# Patient Record
Sex: Female | Born: 1950 | Race: White | Hispanic: No | Marital: Married | Smoking: Former smoker
Health system: Southern US, Community
[De-identification: ages and names within clinical notes are randomized; demographics above are authoritative.]

## PROBLEM LIST (undated history)

## (undated) DIAGNOSIS — D649 Anemia, unspecified: Secondary | ICD-10-CM

## (undated) HISTORY — PX: TONSILLECTOMY: SUR1361

---

## 1998-10-29 ENCOUNTER — Other Ambulatory Visit: Admission: RE | Admit: 1998-10-29 | Discharge: 1998-10-29 | Payer: Self-pay | Admitting: Obstetrics and Gynecology

## 2000-07-12 ENCOUNTER — Other Ambulatory Visit: Admission: RE | Admit: 2000-07-12 | Discharge: 2000-07-12 | Payer: Self-pay | Admitting: Obstetrics and Gynecology

## 2000-07-15 ENCOUNTER — Encounter: Payer: Self-pay | Admitting: Family Medicine

## 2000-07-15 ENCOUNTER — Encounter: Admission: RE | Admit: 2000-07-15 | Discharge: 2000-07-15 | Payer: Self-pay | Admitting: Family Medicine

## 2001-06-20 ENCOUNTER — Other Ambulatory Visit: Admission: RE | Admit: 2001-06-20 | Discharge: 2001-06-20 | Payer: Self-pay | Admitting: Obstetrics and Gynecology

## 2001-07-17 ENCOUNTER — Encounter: Payer: Self-pay | Admitting: Obstetrics and Gynecology

## 2001-07-17 ENCOUNTER — Encounter: Admission: RE | Admit: 2001-07-17 | Discharge: 2001-07-17 | Payer: Self-pay | Admitting: Obstetrics and Gynecology

## 2003-06-13 ENCOUNTER — Ambulatory Visit (HOSPITAL_COMMUNITY): Admission: RE | Admit: 2003-06-13 | Discharge: 2003-06-13 | Payer: Self-pay | Admitting: Gastroenterology

## 2003-07-18 ENCOUNTER — Encounter: Admission: RE | Admit: 2003-07-18 | Discharge: 2003-07-18 | Payer: Self-pay | Admitting: Obstetrics and Gynecology

## 2003-07-18 ENCOUNTER — Encounter: Payer: Self-pay | Admitting: Obstetrics and Gynecology

## 2007-02-07 ENCOUNTER — Encounter: Admission: RE | Admit: 2007-02-07 | Discharge: 2007-02-07 | Payer: Self-pay | Admitting: Obstetrics and Gynecology

## 2010-07-06 ENCOUNTER — Encounter: Admission: RE | Admit: 2010-07-06 | Discharge: 2010-07-06 | Payer: Self-pay | Admitting: Obstetrics and Gynecology

## 2011-05-07 NOTE — Op Note (Signed)
   NAMELAVELL, Escobar                         ACCOUNT NO.:  [id number]   MEDICAL RECORD NO.:  [id number]                   PATIENT TYPE:  AMB   LOCATION:  ENDO                                 FACILITY:  [facility]   PHYSICIAN:  Danise Edge, M.D.                DATE OF BIRTH:  March 23, 1951   DATE OF PROCEDURE:  06/13/2003  DATE OF DISCHARGE:                                 OPERATIVE REPORT   PROCEDURE:  Screening colonoscopy.   REFERRING PHYSICIAN:  Dellis Anes. Idell Pickles, M.D.   INDICATIONS FOR PROCEDURE:  [name] is a 60 year old female born  [birth year].  [name] 80 year old brother was diagnosed with  colon cancer.   ENDOSCOPIST:  Charolett Bumpers, M.D.   PREMEDICATION:  Versed 7.5 mg, Demerol 50 mg.   PROCEDURE:  After obtaining confirmed consent, [name] was placed in  the left lateral decubitus position.  I administered intravenous Demerol and  intravenous  Versed to achieve conscious sedation for the procedure.  The  patient's blood pressure, oxygen saturation, and cardiac rhythm were  monitored throughout the procedure and documented in the medical record.  Anal inspection was normal.  Digital rectal exam was normal.  The Olympus  adjustable pediatric video colonoscope was introduced into the rectum and  advanced to the cecum.  Colonic preparation for the exam today was  excellent.   Rectum normal.  Sigmoid colon and descending colon:  Normal.  Splenic flexure normal.  Transverse colon normal.  Hepatic flexure normal.  Ascending colon normal.  Cecum and ileocecal valve normal.    ASSESSMENT:  Normal screening proctocolonoscopy to the cecum.   RECOMMENDATIONS:  Repeat colonoscopy in five years.                                               Danise Edge, M.D.    MJ/MEDQ  D:  06/13/2003  T:  06/14/2003  Job:  [id number]   cc:   Dellis Anes. Idell Pickles, M.D.  [address]  [place]  [place] [address]  Fax: [phone]

## 2014-03-15 ENCOUNTER — Other Ambulatory Visit: Payer: Self-pay | Admitting: Obstetrics and Gynecology

## 2014-03-15 DIAGNOSIS — N951 Menopausal and female climacteric states: Secondary | ICD-10-CM

## 2014-04-03 ENCOUNTER — Ambulatory Visit
Admission: RE | Admit: 2014-04-03 | Discharge: 2014-04-03 | Disposition: A | Discharge status: Home / Self Care | Payer: BC Managed Care – PPO | Source: Ambulatory Visit | Attending: Obstetrics and Gynecology | Admitting: Obstetrics and Gynecology

## 2014-04-03 DIAGNOSIS — N951 Menopausal and female climacteric states: Secondary | ICD-10-CM

## 2014-12-26 ENCOUNTER — Other Ambulatory Visit: Payer: Self-pay | Admitting: Family Medicine

## 2014-12-26 DIAGNOSIS — M542 Cervicalgia: Secondary | ICD-10-CM

## 2015-01-06 ENCOUNTER — Ambulatory Visit
Admission: RE | Admit: 2015-01-06 | Discharge: 2015-01-06 | Disposition: A | Discharge status: Home / Self Care | Payer: Medicare Other | Source: Ambulatory Visit | Attending: Family Medicine | Admitting: Family Medicine

## 2015-01-06 DIAGNOSIS — M542 Cervicalgia: Secondary | ICD-10-CM

## 2016-03-21 IMAGING — US US SOFT TISSUE HEAD/NECK
1 series · 14 of 20 positions shown · non-contrast
Comparison: None.

CLINICAL DATA: 63-year-old female with a palpable mass in the deep
aspect of the posterior left shoulder.

EXAM:
ULTRASOUND OF HEAD/NECK SOFT TISSUES
TECHNIQUE: Ultrasound examination of the head and neck soft tissues was
performed in the area of clinical concern.

[Series 1: us soft tissue head/neck · 0.09mm/px · 14 of 20 slices shown]
[im 1/20]
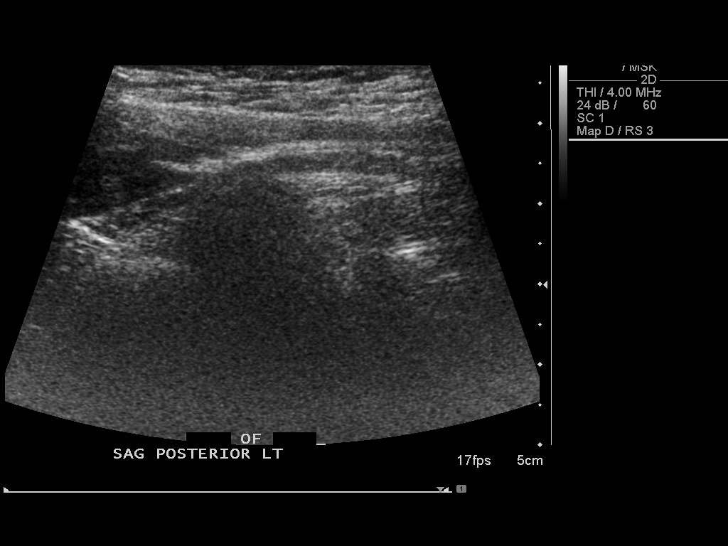
[im 3/20]
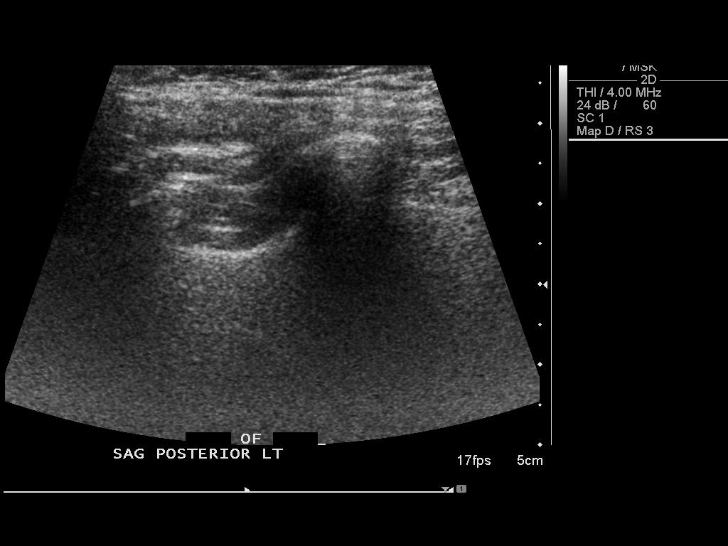
[im 4/20]
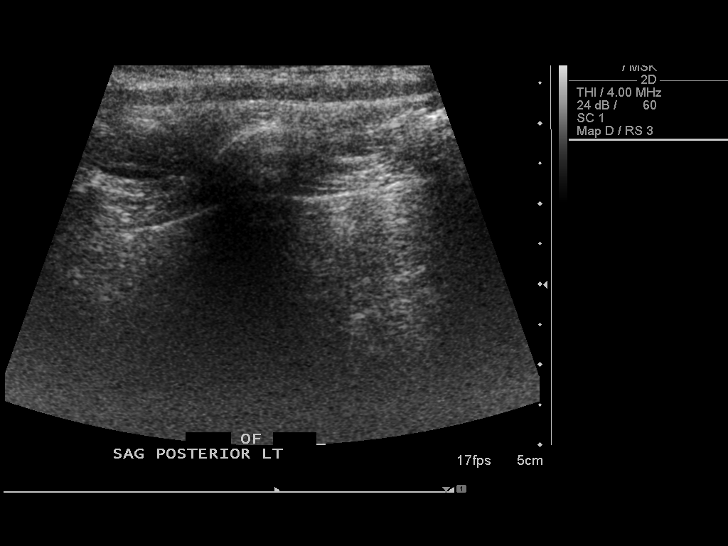
[im 6/20]
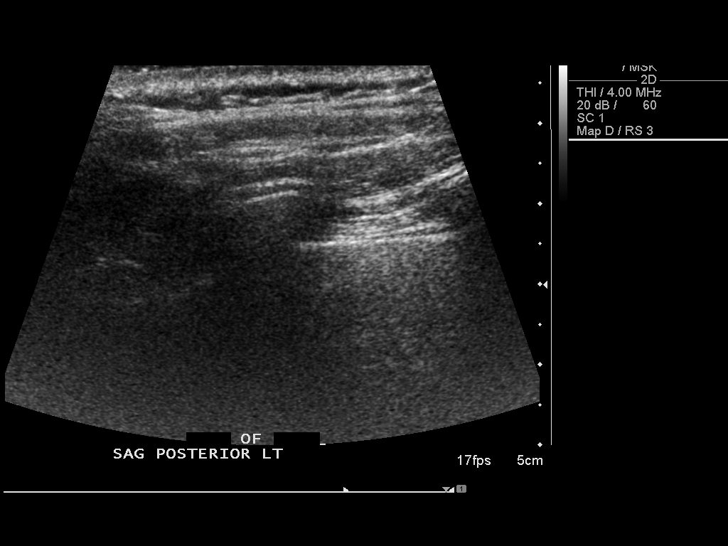
[im 7/20]
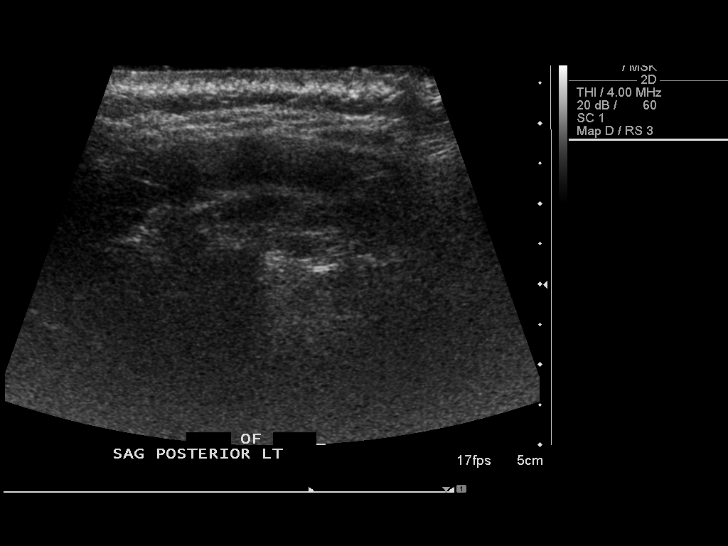
[im 8/20]
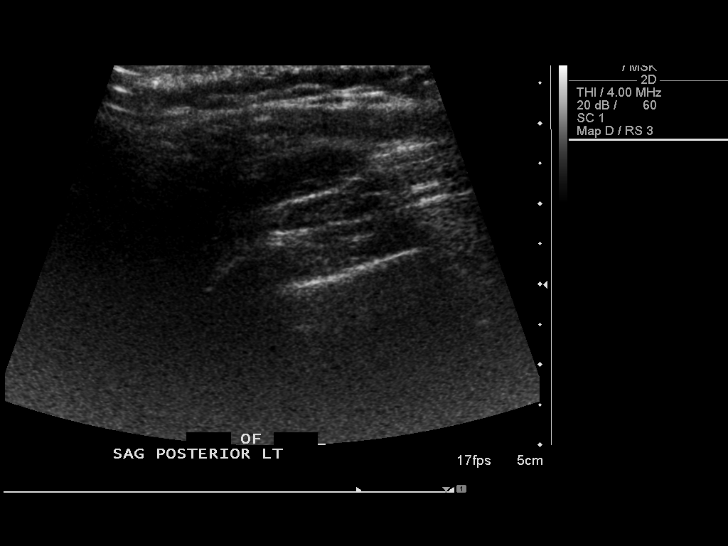
[im 10/20]
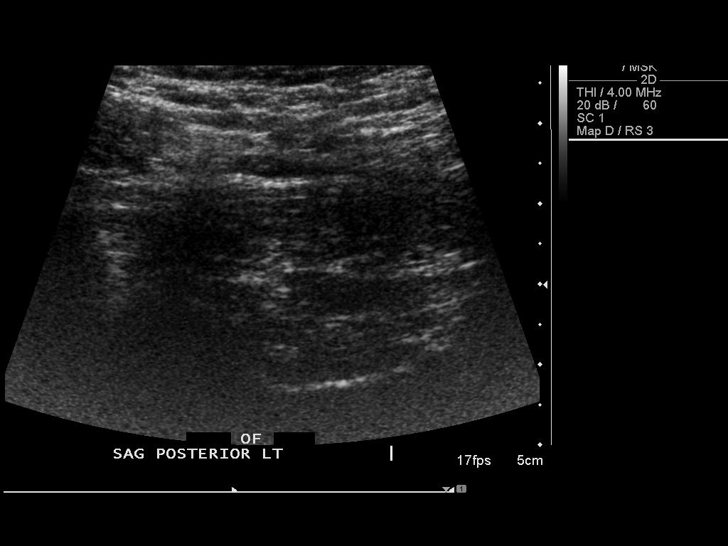
[im 11/20]
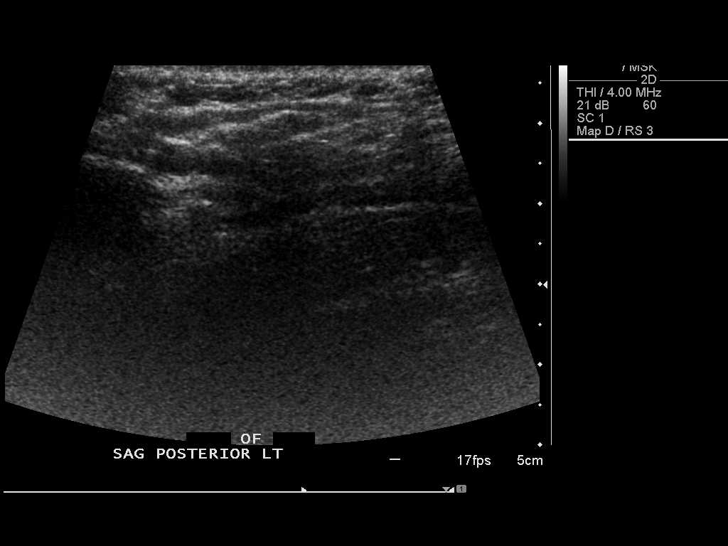
[im 13/20]
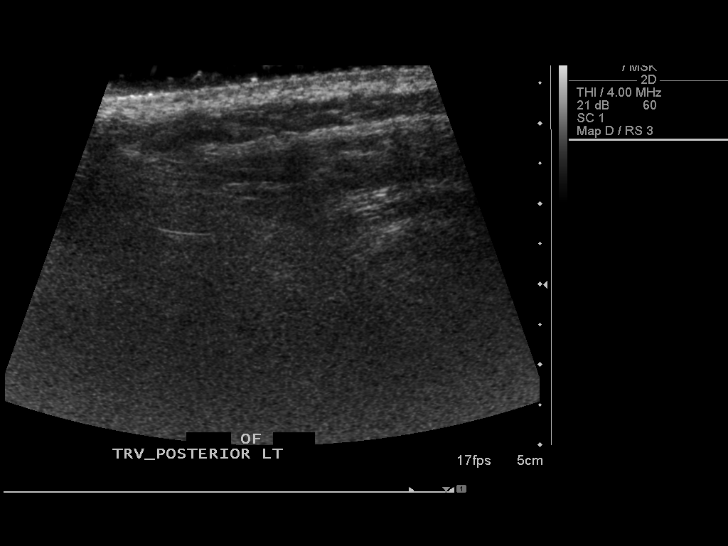
[im 14/20]
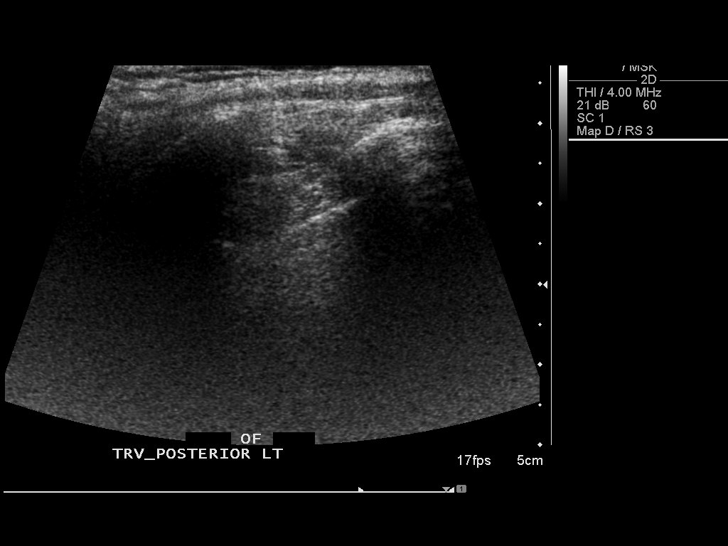
[im 16/20]
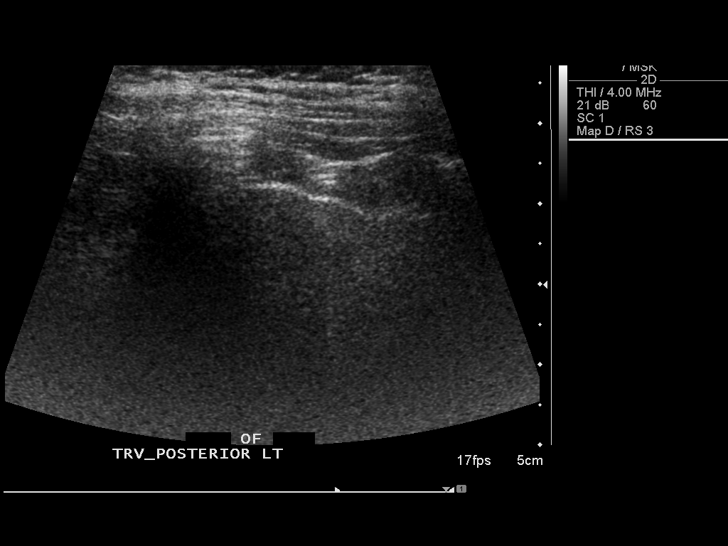
[im 17/20]
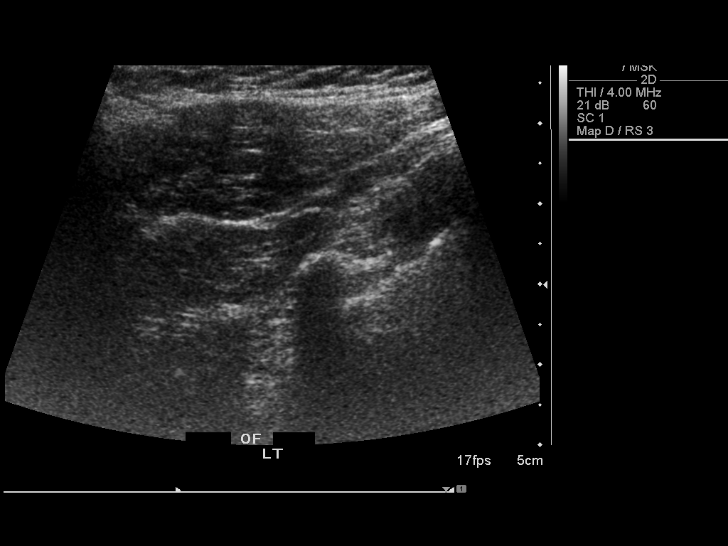
[im 18/20]
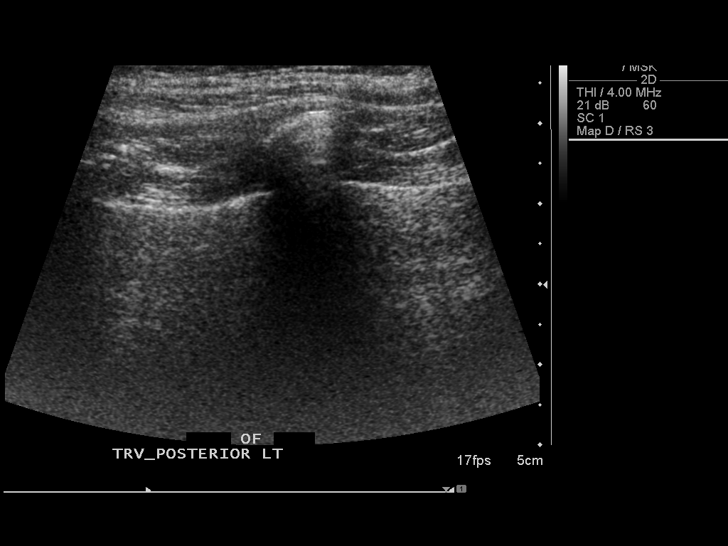
[im 20/20]
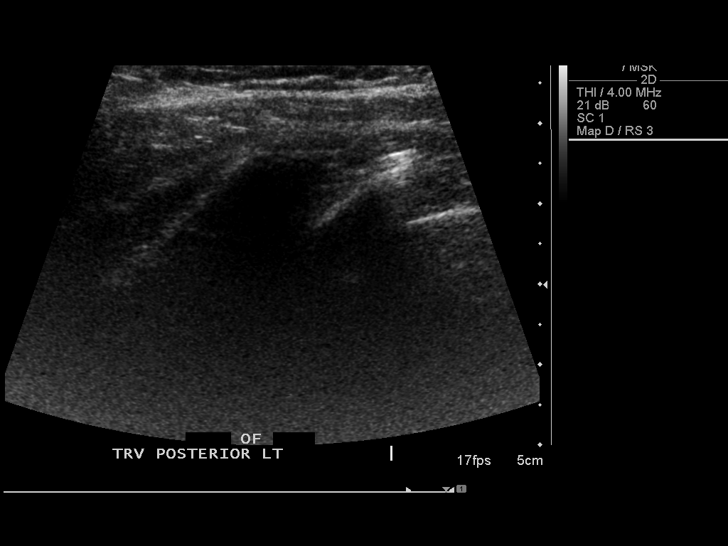

[14 of 20 positions shown; findings below may reference images not displayed]

FINDINGS: Sonographic evaluation of the region of the patient's pain and
palpable mass demonstrate no focal abnormality. There is no soft
tissue mass or fluid collection.
IMPRESSION: No abnormality identified sonographically.

## 2017-03-11 ENCOUNTER — Other Ambulatory Visit: Payer: Self-pay | Admitting: Gastroenterology

## 2017-04-29 ENCOUNTER — Encounter (HOSPITAL_COMMUNITY): Payer: Self-pay

## 2017-05-02 ENCOUNTER — Ambulatory Visit (HOSPITAL_COMMUNITY)
Admission: RE | Admit: 2017-05-02 | Discharge: 2017-05-02 | Disposition: A | Discharge status: Home / Self Care | Payer: Medicare Other | Source: Ambulatory Visit | Attending: Gastroenterology | Admitting: Gastroenterology

## 2017-05-02 ENCOUNTER — Encounter (HOSPITAL_COMMUNITY)
Admission: RE | Disposition: A | Discharge status: Home / Self Care | Payer: Self-pay | Source: Ambulatory Visit | Attending: Gastroenterology

## 2017-05-02 ENCOUNTER — Ambulatory Visit (HOSPITAL_COMMUNITY): Payer: Medicare Other | Admitting: Certified Registered Nurse Anesthetist

## 2017-05-02 ENCOUNTER — Encounter (HOSPITAL_COMMUNITY): Payer: Self-pay | Admitting: *Deleted

## 2017-05-02 DIAGNOSIS — Z800 Family history of malignant neoplasm of digestive organs: Secondary | ICD-10-CM | POA: Insufficient documentation

## 2017-05-02 DIAGNOSIS — Z880 Allergy status to penicillin: Secondary | ICD-10-CM | POA: Insufficient documentation

## 2017-05-02 DIAGNOSIS — M329 Systemic lupus erythematosus, unspecified: Secondary | ICD-10-CM | POA: Diagnosis not present

## 2017-05-02 DIAGNOSIS — M8580 Other specified disorders of bone density and structure, unspecified site: Secondary | ICD-10-CM | POA: Insufficient documentation

## 2017-05-02 DIAGNOSIS — Z1211 Encounter for screening for malignant neoplasm of colon: Secondary | ICD-10-CM | POA: Diagnosis not present

## 2017-05-02 DIAGNOSIS — Z87891 Personal history of nicotine dependence: Secondary | ICD-10-CM | POA: Insufficient documentation

## 2017-05-02 DIAGNOSIS — K562 Volvulus: Secondary | ICD-10-CM | POA: Insufficient documentation

## 2017-05-02 HISTORY — DX: Anemia, unspecified: D64.9

## 2017-05-02 HISTORY — PX: COLONOSCOPY WITH PROPOFOL: SHX5780

## 2017-05-02 SURGERY — COLONOSCOPY WITH PROPOFOL
Anesthesia: Monitor Anesthesia Care

## 2017-05-02 MED ORDER — SODIUM CHLORIDE 0.9 % IV SOLN: INTRAVENOUS | Status: DC

## 2017-05-02 MED ORDER — PROPOFOL 500 MG/50ML IV EMUL
INTRAVENOUS | Status: DC | PRN
  Administered 2017-05-02: 100 ug/kg/min via INTRAVENOUS

## 2017-05-02 MED ORDER — LACTATED RINGERS IV SOLN
INTRAVENOUS | Status: DC
  Administered 2017-05-02: 12:00:00 via INTRAVENOUS

## 2017-05-02 MED ORDER — PROPOFOL 10 MG/ML IV BOLUS
INTRAVENOUS | Status: AC
  Filled 2017-05-02: qty 40

## 2017-05-02 MED ORDER — PROPOFOL 10 MG/ML IV BOLUS
INTRAVENOUS | Status: DC | PRN
  Administered 2017-05-02 (×4): 20 mg via INTRAVENOUS
  Administered 2017-05-02: 50 mg via INTRAVENOUS

## 2017-05-02 SURGICAL SUPPLY — 21 items

## 2017-05-02 NOTE — Anesthesia Preprocedure Evaluation (Addendum)
Anesthesia Evaluation  Patient identified by MRN, date of birth, ID band Patient awake    Reviewed: Allergy & Precautions, NPO status , Patient's Chart, lab work & pertinent test results  Airway Mallampati: I  TM Distance: >3 FB Neck ROM: Full    Dental no notable dental hx.    Pulmonary neg pulmonary ROS, former smoker,    Pulmonary exam normal breath sounds clear to auscultation       Cardiovascular negative cardio ROS Normal cardiovascular exam Rhythm:Regular Rate:Normal     Neuro/Psych negative neurological ROS  negative psych ROS   GI/Hepatic negative GI ROS, Neg liver ROS,   Endo/Other  negative endocrine ROS  Renal/GU negative Renal ROS  negative genitourinary   Musculoskeletal negative musculoskeletal ROS (+)   Abdominal   Peds negative pediatric ROS (+)  Hematology negative hematology ROS (+)   Anesthesia Other Findings H/o Lupus  Reproductive/Obstetrics negative OB ROS                            Anesthesia Physical Anesthesia Plan  ASA: I  Anesthesia Plan: MAC   Post-op Pain Management:    Induction: Intravenous  Airway Management Planned:   Additional Equipment:   Intra-op Plan:   Post-operative Plan:   Informed Consent: I have reviewed the patients History and Physical, chart, labs and discussed the procedure including the risks, benefits and alternatives for the proposed anesthesia with the patient or authorized representative who has indicated his/her understanding and acceptance.   Dental advisory given  Plan Discussed with: CRNA and Surgeon  Anesthesia Plan Comments:        Anesthesia Quick Evaluation

## 2017-05-02 NOTE — Op Note (Signed)
[facility] Patient Name: Kathryn Escobar Procedure Date: 05/02/2017 MRN: [id number] Attending MD: Garlan Fair , MD Date of Birth: [birth year] CSN: [id number] Age: 66 Admit Type: Outpatient Procedure:                Colonoscopy Indications:              Screening patient at increased risk: Brother was                            diagnosed with colon cancer at age 46. Normal                            screening colonoscopies were performed in 2004 and                            2013. Providers:                Garlan Fair, MD, Laverta Baltimore RN, RN,                            Cherylynn Ridges, Technician, Heide Scales, CRNA Referring MD:              Medicines:                Propofol per Anesthesia Complications:            No immediate complications. Estimated Blood Loss:     Estimated blood loss: none. Procedure:                Pre-Anesthesia Assessment:                           - Prior to the procedure, a History and Physical                            was performed, and patient medications and                            allergies were reviewed. The patient's tolerance of                            previous anesthesia was also reviewed. The risks                            and benefits of the procedure and the sedation                            options and risks were discussed with the patient.                            All questions were answered, and informed consent                            was obtained. Prior Anticoagulants: The patient has  taken no previous anticoagulant or antiplatelet                            agents. ASA Grade Assessment: II - A patient with                            mild systemic disease. After reviewing the risks                            and benefits, the patient was deemed in                            satisfactory condition to undergo the procedure.                           After obtaining  informed consent, the colonoscope                            was passed under direct vision. Throughout the                            procedure, the patient's blood pressure, pulse, and                            oxygen saturations were monitored continuously. The                            EC-[id number] ([id number]) scope was introduced through                            the anus and advanced to the the cecum, identified                            by appendiceal orifice and ileocecal valve. The                            colonoscopy was technically difficult and complex                            due to significant looping. The patient tolerated                            the procedure well. The quality of the bowel                            preparation was good. The appendiceal orifice and                            the rectum were photographed. Scope In: 1:00:19 PM Scope Out: 1:26:43 PM Scope Withdrawal Time: 0 hours 11 minutes 53 seconds  Total Procedure Duration: 0 hours 26 minutes 24 seconds  Findings:      The perianal and digital rectal examinations were normal.      The entire examined colon appeared normal.  Impression:               - The entire examined colon is normal.                           - No specimens collected. Moderate Sedation:      N/A- Per Anesthesia Care Recommendation:           - Patient has a contact number available for                            emergencies. The signs and symptoms of potential                            delayed complications were discussed with the                            patient. Return to normal activities tomorrow.                            Written discharge instructions were provided to the                            patient.                           - Repeat colonoscopy in 8 years for screening                            purposes.                           - Resume previous diet.                           - Continue present  medications. Procedure Code(s):        --- Professional ---                           Z6109, Colorectal cancer screening; colonoscopy on                            individual at high risk Diagnosis Code(s):        --- Professional ---                           Z80.0, Family history of malignant neoplasm of                            digestive organs CPT copyright 2016 American Medical Association. All rights reserved. The codes documented in this report are preliminary and upon coder review may  be revised to meet current compliance requirements. Earle Gell, MD Garlan Fair, MD 05/02/2017 1:34:31 PM This report has been signed electronically. Number of Addenda: 0

## 2017-05-02 NOTE — H&P (Signed)
Procedure: Screening colonoscopy. Brother was diagnosed with colon cancer at age 68. Normal screening colonoscopies were performed in 2004 and 2013  History: The patient is a 66 year old female born [birth year]. She is scheduled to undergo a repeat screening colonoscopy today.  Medication allergies: Penicillin caused rash  Past medical history: Tonsillectomy. Systemic lupus erythematosus. Osteopenia. Low vitamin D level.  Exam: The patient is alert and lying comfortably on the endoscopy stretcher. Abdomen is soft and nontender to palpation. Lungs are clear to auscultation. Cardiac exam reveals a regular rhythm.  Plan: Proceed with screening colonoscopy

## 2017-05-02 NOTE — Transfer of Care (Signed)
Immediate Anesthesia Transfer of Care Note  Patient: Kathryn Escobar  Procedure(s) Performed: Procedure(s): COLONOSCOPY WITH PROPOFOL (N/A)  Patient Location: PACU  Anesthesia Type:MAC  Level of Consciousness: Patient easily awoken, sedated, comfortable, cooperative, following commands, responds to stimulation.   Airway & Oxygen Therapy: Patient spontaneously breathing, ventilating well, oxygen via simple oxygen mask.  Post-op Assessment: Report given to PACU RN, vital signs reviewed and stable, moving all extremities.   Post vital signs: Reviewed and stable.  Complications: No apparent anesthesia complications Last Vitals:  Vitals:   05/02/17 1148  BP: 125/72  Pulse: 65  Resp: 17  Temp: 36.4 C    Last Pain:  Vitals:   05/02/17 1148  TempSrc: Oral         Complications: No apparent anesthesia complications

## 2017-05-02 NOTE — Anesthesia Postprocedure Evaluation (Signed)
Anesthesia Post Note  Patient: Teriann Livingood  Procedure(s) Performed: Procedure(s) (LRB): COLONOSCOPY WITH PROPOFOL (N/A)  Patient location during evaluation: PACU Anesthesia Type: MAC Level of consciousness: awake and alert Pain management: pain level controlled Vital Signs Assessment: post-procedure vital signs reviewed and stable Respiratory status: spontaneous breathing, nonlabored ventilation, respiratory function stable and patient connected to nasal cannula oxygen Cardiovascular status: stable and blood pressure returned to baseline Anesthetic complications: no       Last Vitals:  Vitals:   05/02/17 1352 05/02/17 1359  BP: (!) 76/56 130/76  Pulse: 63 62  Resp: (!) 22 15  Temp:      Last Pain:  Vitals:   05/02/17 1148  TempSrc: Oral                 Ryan P Ellender

## 2017-05-02 NOTE — Discharge Instructions (Signed)

## 2017-05-03 ENCOUNTER — Encounter (HOSPITAL_COMMUNITY): Payer: Self-pay | Admitting: Gastroenterology

## 2020-01-14 ENCOUNTER — Ambulatory Visit: Payer: Medicare Other | Attending: Internal Medicine

## 2020-01-14 DIAGNOSIS — Z23 Encounter for immunization: Secondary | ICD-10-CM | POA: Insufficient documentation

## 2020-01-14 NOTE — Progress Notes (Signed)
   Covid-19 Vaccination Clinic  Name:  Kathryn Escobar    MRN: [id number] DOB: January 23, 1951  01/14/2020  Kathryn Escobar was observed post Covid-19 immunization for 15 minutes without incidence. She was provided with Vaccine Information Sheet and instruction to access the V-Safe system.   Kathryn Escobar was instructed to call 911 with any severe reactions post vaccine: Marland Kitchen Difficulty breathing  . Swelling of your face and throat  . A fast heartbeat  . A bad rash all over your body  . Dizziness and weakness    Immunizations Administered    Name Date Dose VIS Date Route   Pfizer COVID-19 Vaccine 01/14/2020  8:21 AM 0.3 mL 11/30/2019 Intramuscular   Manufacturer: [place]   Lot: [id number]   [place]: [id number]

## 2020-02-05 ENCOUNTER — Ambulatory Visit: Payer: Medicare Other

## 2020-02-08 ENCOUNTER — Ambulatory Visit: Payer: Medicare Other

## 2020-02-10 ENCOUNTER — Ambulatory Visit: Payer: Medicare Other | Attending: Internal Medicine

## 2020-02-10 DIAGNOSIS — Z23 Encounter for immunization: Secondary | ICD-10-CM

## 2020-02-10 NOTE — Progress Notes (Signed)
   Covid-19 Vaccination Clinic  Name:  Jini Kastens    MRN: [id number] DOB: May 19, 1951  02/10/2020  Ms. Fluke was observed post Covid-19 immunization for 15 minutes without incidence. She was provided with Vaccine Information Sheet and instruction to access the V-Safe system.   Ms. Rosato was instructed to call 911 with any severe reactions post vaccine: Marland Kitchen Difficulty breathing  . Swelling of your face and throat  . A fast heartbeat  . A bad rash all over your body  . Dizziness and weakness    Immunizations Administered    Name Date Dose VIS Date Route   Pfizer COVID-19 Vaccine 02/10/2020  8:51 AM 0.3 mL 11/30/2019 Intramuscular   Manufacturer: [place]   Lot: [id number]   [place]: [id number]

## 2020-06-02 ENCOUNTER — Other Ambulatory Visit: Payer: Self-pay | Admitting: Obstetrics and Gynecology

## 2020-06-02 DIAGNOSIS — E2839 Other primary ovarian failure: Secondary | ICD-10-CM

## 2020-09-12 ENCOUNTER — Ambulatory Visit: Payer: Medicare Other | Attending: Internal Medicine

## 2020-09-12 DIAGNOSIS — Z23 Encounter for immunization: Secondary | ICD-10-CM

## 2020-09-12 NOTE — Progress Notes (Signed)
   Covid-19 Vaccination Clinic  Name:  Mckenize Mezera    MRN: [id number] DOB: 1951-02-03  09/12/2020  Ms. Stampley was observed post Covid-19 immunization for 15 minutes without incident. She was provided with Vaccine Information Sheet and instruction to access the V-Safe system. Vaccinated by [facility].  Ms. Tullo was instructed to call 911 with any severe reactions post vaccine: Marland Kitchen Difficulty breathing  . Swelling of face and throat  . A fast heartbeat  . A bad rash all over body  . Dizziness and weakness

## 2020-09-16 ENCOUNTER — Ambulatory Visit
Admission: RE | Admit: 2020-09-16 | Discharge: 2020-09-16 | Disposition: A | Discharge status: Home / Self Care | Payer: Medicare Other | Source: Ambulatory Visit | Attending: Obstetrics and Gynecology | Admitting: Obstetrics and Gynecology

## 2020-09-16 ENCOUNTER — Other Ambulatory Visit: Payer: Self-pay

## 2020-09-16 DIAGNOSIS — E2839 Other primary ovarian failure: Secondary | ICD-10-CM

## 2021-02-04 ENCOUNTER — Ambulatory Visit (INDEPENDENT_AMBULATORY_CARE_PROVIDER_SITE_OTHER): Payer: Medicare Other | Admitting: Dermatology

## 2021-02-04 ENCOUNTER — Encounter: Payer: Self-pay | Admitting: Dermatology

## 2021-02-04 ENCOUNTER — Other Ambulatory Visit: Payer: Self-pay

## 2021-02-04 DIAGNOSIS — L700 Acne vulgaris: Secondary | ICD-10-CM

## 2021-02-04 DIAGNOSIS — L298 Other pruritus: Secondary | ICD-10-CM

## 2021-02-04 DIAGNOSIS — L720 Epidermal cyst: Secondary | ICD-10-CM

## 2021-02-04 DIAGNOSIS — L309 Dermatitis, unspecified: Secondary | ICD-10-CM | POA: Diagnosis not present

## 2021-02-04 DIAGNOSIS — D485 Neoplasm of uncertain behavior of skin: Secondary | ICD-10-CM

## 2021-02-04 DIAGNOSIS — C44319 Basal cell carcinoma of skin of other parts of face: Secondary | ICD-10-CM | POA: Diagnosis not present

## 2021-02-04 DIAGNOSIS — C4491 Basal cell carcinoma of skin, unspecified: Secondary | ICD-10-CM

## 2021-02-04 HISTORY — DX: Basal cell carcinoma of skin, unspecified: C44.91

## 2021-02-04 NOTE — Patient Instructions (Addendum)
Pick up over the counter Hydrocortisone Ointment also pick up Cerave Itch Relief    Biopsy, Surgery (Curettage) & Surgery (Excision) Aftercare Instructions  1. Okay to remove bandage in 24 hours  2. Wash area with soap and water  3. Apply Vaseline to area twice daily until healed (Not Neosporin)  4. Okay to cover with a Band-Aid to decrease the chance of infection or prevent irritation from clothing; also it's okay to uncover lesion at home.  5. Suture instructions: return to our office in 7-10 or 10-14 days for a nurse visit for suture removal. Variable healing with sutures, if pain or itching occurs call our office. It's okay to shower or bathe 24 hours after sutures are given.  6. The following risks may occur after a biopsy, curettage or excision: bleeding, scarring, discoloration, recurrence, infection (redness, yellow drainage, pain or swelling).  7. For questions, concerns and results call our office at [place] before 4pm & Friday before 3pm. Biopsy results will be available in 1 week.

## 2021-02-08 ENCOUNTER — Encounter: Payer: Self-pay | Admitting: Dermatology

## 2021-02-08 NOTE — Progress Notes (Addendum)
   New Patient   Subjective  Kathryn Escobar is a 70 y.o. female who presents for the following: Annual Exam and Skin Problem (Right chin x years - been there for awhile).  Growth right chin and general skin check Location:  Duration:  Quality:  Associated Signs/Symptoms: Modifying Factors:  Severity:  Timing: Context:    The following portions of the chart were reviewed this encounter and updated as appropriate:  Tobacco  Allergies  Meds  Problems  Med Hx  Surg Hx  Fam Hx      Objective  Well appearing patient in no apparent distress; mood and affect are within normal limits. Objective  Right Chin: Waxy 1 cm pink slightly raised nodule with more elevated erythematous 3 mm papule at the inferior margin.  Rule out [place].       Objective  Left Buccal Cheek, Mid Lower Cutaneous Lip, Right Buccal Cheek: Subtle patches of mild dermatitis, perhaps related to Covid mask.  If this becomes bothersome she may try some over-the-counter 1% hydrocortisone ointment for 7 nights.  Objective  Mid Back: Subtle diffuse dryness especially torso  Objective  Mid Back: Blackhead-like opening on top of small dermal cyst.   A full examination was performed including scalp, head, eyes, ears, nose, lips, neck, chest, axillae, abdomen, back, buttocks, bilateral upper extremities, bilateral lower extremities, hands, feet, fingers, toes, fingernails, and toenails. All findings within normal limits unless otherwise noted below.   Assessment & Plan  Neoplasm of uncertain behavior of skin Right Chin  Skin / nail biopsy Type of biopsy: tangential   Informed consent: discussed and consent obtained   Timeout: patient name, date of birth, surgical site, and procedure verified   Procedure prep:  Patient was prepped and draped in usual sterile fashion (Non sterile) Prep type:  Chlorhexidine Anesthesia: the lesion was anesthetized in a standard fashion   Anesthetic:  1% lidocaine w/  epinephrine 1-100,000 local infiltration Instrument used: flexible razor blade   Hemostasis achieved with: ferric subsulfate   Outcome: patient tolerated procedure well   Post-procedure details: sterile dressing applied and wound care instructions given   Dressing type: bandage and petrolatum    Specimen 1 - Surgical pathology Differential Diagnosis: R/O BCC vs SCC  Check Margins: No  Dermatitis (3) Mid Lower Cutaneous Lip; Left Buccal Cheek; Right Buccal Cheek  If becomes bothersome may try 1% hydrocortisone ointment (over-the-counter)  Xerotic eczema Mid Back  Discussed optimal ways to hydrate her skin.  If this is not helpful we will consider prescribing topical triamcinolone for the possibility of this being mild eczema.  Epidermoid cyst Mid Back  Not bothersome to patient, no intervention currently indicated.

## 2021-02-16 ENCOUNTER — Telehealth: Payer: Self-pay | Admitting: *Deleted

## 2021-02-16 ENCOUNTER — Encounter: Payer: Self-pay | Admitting: *Deleted

## 2021-02-16 NOTE — Telephone Encounter (Signed)
Pathology to patient- referral sent to skin surgery center for MOHS 

## 2021-02-16 NOTE — Telephone Encounter (Signed)
-----   Message from Lavonna Monarch, MD sent at 02/13/2021  9:06 AM EST ----- Schedule Mohs

## 2021-04-22 DIAGNOSIS — M810 Age-related osteoporosis without current pathological fracture: Secondary | ICD-10-CM | POA: Diagnosis not present

## 2021-04-22 DIAGNOSIS — Z800 Family history of malignant neoplasm of digestive organs: Secondary | ICD-10-CM | POA: Diagnosis not present

## 2021-04-22 DIAGNOSIS — E559 Vitamin D deficiency, unspecified: Secondary | ICD-10-CM | POA: Diagnosis not present

## 2021-04-29 DIAGNOSIS — Z0000 Encounter for general adult medical examination without abnormal findings: Secondary | ICD-10-CM | POA: Diagnosis not present

## 2021-04-29 DIAGNOSIS — C44319 Basal cell carcinoma of skin of other parts of face: Secondary | ICD-10-CM | POA: Diagnosis not present

## 2021-08-10 DIAGNOSIS — S6990XA Unspecified injury of unspecified wrist, hand and finger(s), initial encounter: Secondary | ICD-10-CM | POA: Diagnosis not present

## 2021-08-10 DIAGNOSIS — S63619A Unspecified sprain of unspecified finger, initial encounter: Secondary | ICD-10-CM | POA: Diagnosis not present

## 2021-09-14 DIAGNOSIS — L910 Hypertrophic scar: Secondary | ICD-10-CM | POA: Diagnosis not present

## 2021-12-10 DIAGNOSIS — Z48817 Encounter for surgical aftercare following surgery on the skin and subcutaneous tissue: Secondary | ICD-10-CM | POA: Diagnosis not present

## 2022-02-08 ENCOUNTER — Ambulatory Visit: Payer: Medicare Other | Admitting: Dermatology

## 2022-04-23 DIAGNOSIS — I739 Peripheral vascular disease, unspecified: Secondary | ICD-10-CM | POA: Diagnosis not present

## 2022-04-23 DIAGNOSIS — Z6821 Body mass index (BMI) 21.0-21.9, adult: Secondary | ICD-10-CM | POA: Diagnosis not present

## 2022-04-23 DIAGNOSIS — M810 Age-related osteoporosis without current pathological fracture: Secondary | ICD-10-CM | POA: Diagnosis not present

## 2022-04-23 DIAGNOSIS — Z79899 Other long term (current) drug therapy: Secondary | ICD-10-CM | POA: Diagnosis not present

## 2022-04-30 DIAGNOSIS — Z0000 Encounter for general adult medical examination without abnormal findings: Secondary | ICD-10-CM | POA: Diagnosis not present

## 2022-05-10 ENCOUNTER — Other Ambulatory Visit: Payer: Self-pay | Admitting: Family Medicine

## 2022-05-10 DIAGNOSIS — M810 Age-related osteoporosis without current pathological fracture: Secondary | ICD-10-CM

## 2022-05-10 DIAGNOSIS — Z1231 Encounter for screening mammogram for malignant neoplasm of breast: Secondary | ICD-10-CM

## 2022-06-11 ENCOUNTER — Ambulatory Visit: Payer: Medicare Other

## 2022-06-18 ENCOUNTER — Ambulatory Visit
Admission: RE | Admit: 2022-06-18 | Discharge: 2022-06-18 | Disposition: A | Discharge status: Home / Self Care | Payer: Medicare Other | Source: Ambulatory Visit | Attending: Family Medicine | Admitting: Family Medicine

## 2022-06-18 DIAGNOSIS — Z1231 Encounter for screening mammogram for malignant neoplasm of breast: Secondary | ICD-10-CM | POA: Diagnosis not present

## 2022-07-26 DIAGNOSIS — E7800 Pure hypercholesterolemia, unspecified: Secondary | ICD-10-CM | POA: Diagnosis not present

## 2022-10-25 DIAGNOSIS — I739 Peripheral vascular disease, unspecified: Secondary | ICD-10-CM | POA: Diagnosis not present

## 2022-10-25 DIAGNOSIS — E7800 Pure hypercholesterolemia, unspecified: Secondary | ICD-10-CM | POA: Diagnosis not present

## 2022-10-25 DIAGNOSIS — M810 Age-related osteoporosis without current pathological fracture: Secondary | ICD-10-CM | POA: Diagnosis not present

## 2022-11-02 ENCOUNTER — Ambulatory Visit
Admission: RE | Admit: 2022-11-02 | Discharge: 2022-11-02 | Disposition: A | Discharge status: Home / Self Care | Payer: Medicare Other | Source: Ambulatory Visit | Attending: Family Medicine | Admitting: Family Medicine

## 2022-11-02 DIAGNOSIS — Z780 Asymptomatic menopausal state: Secondary | ICD-10-CM | POA: Diagnosis not present

## 2022-11-02 DIAGNOSIS — M810 Age-related osteoporosis without current pathological fracture: Secondary | ICD-10-CM

## 2023-05-02 DIAGNOSIS — Z6821 Body mass index (BMI) 21.0-21.9, adult: Secondary | ICD-10-CM | POA: Diagnosis not present

## 2023-05-02 DIAGNOSIS — E7800 Pure hypercholesterolemia, unspecified: Secondary | ICD-10-CM | POA: Diagnosis not present

## 2023-05-02 DIAGNOSIS — Z79899 Other long term (current) drug therapy: Secondary | ICD-10-CM | POA: Diagnosis not present

## 2023-05-02 DIAGNOSIS — I739 Peripheral vascular disease, unspecified: Secondary | ICD-10-CM | POA: Diagnosis not present

## 2023-05-02 DIAGNOSIS — M810 Age-related osteoporosis without current pathological fracture: Secondary | ICD-10-CM | POA: Diagnosis not present

## 2023-05-03 DIAGNOSIS — Z9181 History of falling: Secondary | ICD-10-CM | POA: Diagnosis not present

## 2023-05-03 DIAGNOSIS — Z0000 Encounter for general adult medical examination without abnormal findings: Secondary | ICD-10-CM | POA: Diagnosis not present

## 2023-10-24 DIAGNOSIS — Z636 Dependent relative needing care at home: Secondary | ICD-10-CM | POA: Diagnosis not present

## 2023-10-24 DIAGNOSIS — E7800 Pure hypercholesterolemia, unspecified: Secondary | ICD-10-CM | POA: Diagnosis not present

## 2023-10-24 DIAGNOSIS — M810 Age-related osteoporosis without current pathological fracture: Secondary | ICD-10-CM | POA: Diagnosis not present

## 2023-10-24 DIAGNOSIS — Z6822 Body mass index (BMI) 22.0-22.9, adult: Secondary | ICD-10-CM | POA: Diagnosis not present

## 2024-05-08 DIAGNOSIS — Z23 Encounter for immunization: Secondary | ICD-10-CM | POA: Diagnosis not present

## 2024-05-08 DIAGNOSIS — E7800 Pure hypercholesterolemia, unspecified: Secondary | ICD-10-CM | POA: Diagnosis not present

## 2024-05-08 DIAGNOSIS — M810 Age-related osteoporosis without current pathological fracture: Secondary | ICD-10-CM | POA: Diagnosis not present

## 2024-05-08 DIAGNOSIS — Z0000 Encounter for general adult medical examination without abnormal findings: Secondary | ICD-10-CM | POA: Diagnosis not present
